# Patient Record
Sex: Female | Born: 2005 | Hispanic: Yes | Marital: Single | Smoking: Never smoker
Health system: Southern US, Community
[De-identification: ages and names within clinical notes are randomized; demographics above are authoritative.]

## PROBLEM LIST (undated history)

## (undated) DIAGNOSIS — T300 Burn of unspecified body region, unspecified degree: Secondary | ICD-10-CM

## (undated) DIAGNOSIS — Z789 Other specified health status: Secondary | ICD-10-CM

## (undated) HISTORY — DX: Other specified health status: Z78.9

## (undated) HISTORY — PX: TONSILLECTOMY: SUR1361

## (undated) HISTORY — PX: MYRINGOTOMY WITH TUBE PLACEMENT: SHX5663

---
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
## 2024-02-27 ENCOUNTER — Ambulatory Visit (INDEPENDENT_AMBULATORY_CARE_PROVIDER_SITE_OTHER): Payer: Medicaid Other | Admitting: Pediatrics

## 2024-02-27 VITALS — Wt 200.4 lb

## 2024-02-27 DIAGNOSIS — Z308 Encounter for other contraceptive management: Secondary | ICD-10-CM | POA: Diagnosis not present

## 2024-02-27 DIAGNOSIS — H9201 Otalgia, right ear: Secondary | ICD-10-CM

## 2024-02-27 MED ORDER — CIPROFLOXACIN-DEXAMETHASONE 0.3-0.1 % OT SUSP: 4.0000 [drp] | Freq: Two times a day (BID) | OTIC | 0 refills | Status: AC

## 2024-02-27 MED ORDER — NORGESTIMATE-ETH ESTRADIOL 0.25-35 MG-MCG PO TABS: 1.0000 | ORAL_TABLET | Freq: Every day | ORAL | 11 refills | Status: AC

## 2024-02-27 NOTE — Progress Notes (Signed)
  Subjective:    Patricia Gibson is a 18 y.o. old female here by for Follow-up (Left ear pain and blood) .    HPI  Has had some spotting despite OCPs -  Dark in color Gets nausea  Still taking - about to finish this current pack Would be interested in establishing with GYN  Right ear - bleeding - no remembered injury Has had PE tube in the past  Review of Systems  Constitutional:  Negative for activity change, appetite change and unexpected weight change.  Genitourinary:  Negative for vaginal pain.       Objective:    Wt 200 lb 6.4 oz (90.9 kg)   BMI 35.96 kg/m  Physical Exam Constitutional:      Appearance: Normal appearance.  HENT:     Left Ear: Tympanic membrane normal.     Ears:     Comments: Appears to have ball of wax in right ear canal with ?extruded PE tube vs hard wax vs cotton in canal Cardiovascular:     Rate and Rhythm: Normal rate and regular rhythm.  Pulmonary:     Effort: Pulmonary effort is normal.     Breath sounds: Normal breath sounds.  Neurological:     Mental Status: She is alert.        Assessment and Plan:     Patricia Gibson was seen today for Follow-up (Left ear pain and blood) .   Problem List Items Addressed This Visit   None Visit Diagnoses       Right ear pain    -  Primary     Encounter for other contraceptive management          Right ear - has wax in ear canal and some associated pain. Unable to remove with curette. Will reer back to ENT  Long discussion about OCP options will change to different class of endocrine. Will need ongoing survellience - can be here with me, adolescent NP, or can choose to go to GYN Will think about it and then let us  know  Time spent reviewing chart in preparation for visit: 5 minutes Time spent face-to-face with patient: 15 minutes Time spent not face-to-face with patient for documentation and care coordination on date of service: 5 minutes   No follow-ups on file.  Alvena Aurora, MD

## 2024-03-04 DIAGNOSIS — L929 Granulomatous disorder of the skin and subcutaneous tissue, unspecified: Secondary | ICD-10-CM | POA: Diagnosis not present

## 2024-03-18 DIAGNOSIS — L929 Granulomatous disorder of the skin and subcutaneous tissue, unspecified: Secondary | ICD-10-CM | POA: Diagnosis not present

## 2024-03-20 ENCOUNTER — Ambulatory Visit (INDEPENDENT_AMBULATORY_CARE_PROVIDER_SITE_OTHER): Admitting: Pediatrics

## 2024-03-20 ENCOUNTER — Encounter: Payer: Self-pay | Admitting: Pediatrics

## 2024-03-20 VITALS — Temp 98.2°F | Wt 200.2 lb

## 2024-03-20 DIAGNOSIS — B349 Viral infection, unspecified: Secondary | ICD-10-CM

## 2024-03-20 LAB — POC SOFIA 2 FLU + SARS ANTIGEN FIA
Influenza A, POC: NEGATIVE
Influenza B, POC: NEGATIVE
SARS Coronavirus 2 Ag: NEGATIVE
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
## 2024-03-22 ENCOUNTER — Ambulatory Visit
Admission: RE | Admit: 2024-03-22 | Discharge: 2024-03-22 | Disposition: A | Discharge status: Home / Self Care | Source: Ambulatory Visit | Attending: Internal Medicine | Admitting: Internal Medicine

## 2024-03-22 ENCOUNTER — Other Ambulatory Visit: Payer: Self-pay

## 2024-03-22 VITALS — BP 122/78 | HR 83 | Temp 98.2°F | Resp 18

## 2024-03-22 DIAGNOSIS — N3000 Acute cystitis without hematuria: Secondary | ICD-10-CM | POA: Diagnosis not present

## 2024-03-22 LAB — POCT URINALYSIS DIP (MANUAL ENTRY)
Bilirubin, UA: NEGATIVE
Glucose, UA: 100 mg/dL — AB
Ketones, POC UA: NEGATIVE mg/dL
Leukocytes, UA: NEGATIVE
Nitrite, UA: POSITIVE — AB
Protein Ur, POC: 30 mg/dL — AB
Spec Grav, UA: >=1.03 — AB (ref 1.010–1.025)
Urobilinogen, UA: 0.2 U/dL
pH, UA: 6 (ref 5.0–8.0)

## 2024-03-22 MED ORDER — NITROFURANTOIN MONOHYD MACRO 100 MG PO CAPS: 100.0000 mg | ORAL_CAPSULE | Freq: Two times a day (BID) | ORAL | 0 refills | Status: DC

## 2024-03-22 NOTE — Discharge Instructions (Addendum)
 Urinalysis done today shows a urinary tract infection. We will treat this with antibiotics. We will treat with the following:  Macrobid 100 mg twice daily for 5 days. This is an antibiotic. Drink plenty of water, stay hydrated. Avoid a lot of caffeine. Tylenol  or ibuprofen  for fever or pain.  Return to urgent care or PCP if symptoms worsen or fail to resolve.

## 2024-03-22 NOTE — ED Triage Notes (Signed)
 Pt here for dysuria; pt sts sore throat and fever 1 week ago that is resolved; pt seen by PCP and had testing; pt sts now having dysuria x 3 days
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
## 2024-04-25 ENCOUNTER — Encounter: Payer: Self-pay | Admitting: Emergency Medicine

## 2024-04-25 ENCOUNTER — Ambulatory Visit
Admission: EM | Admit: 2024-04-25 | Discharge: 2024-04-25 | Disposition: A | Discharge status: Home / Self Care | Attending: Physician Assistant | Admitting: Physician Assistant

## 2024-04-25 DIAGNOSIS — N3001 Acute cystitis with hematuria: Secondary | ICD-10-CM | POA: Diagnosis not present

## 2024-04-25 LAB — POCT URINALYSIS DIP (MANUAL ENTRY)
Bilirubin, UA: NEGATIVE
Glucose, UA: 100 mg/dL — AB
Ketones, POC UA: NEGATIVE mg/dL
Nitrite, UA: POSITIVE — AB
Protein Ur, POC: 30 mg/dL — AB
Spec Grav, UA: 1.025 (ref 1.010–1.025)
Urobilinogen, UA: 0.2 U/dL
pH, UA: 6 (ref 5.0–8.0)

## 2024-04-25 LAB — POCT FASTING CBG KUC MANUAL ENTRY: POCT Glucose (KUC): 149 mg/dL — AB (ref 70–99)

## 2024-04-25 MED ORDER — SULFAMETHOXAZOLE-TRIMETHOPRIM 800-160 MG PO TABS: 1.0000 | ORAL_TABLET | Freq: Two times a day (BID) | ORAL | 0 refills | Status: AC

## 2024-04-25 NOTE — ED Triage Notes (Signed)
 Pt reports painful urination and urinary frequency x 1 day. States tried AZO this morning with no relief.
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
## 2024-04-25 NOTE — Discharge Instructions (Addendum)
 Your blood sugar is not at a level that is concerning for diabetes.  I do recommend that you follow-up with your primary care to investigate this further.  We will contact you if we need to arrange additional treatment based on your culture results.  Start Bactrim  DS twice daily for 5 days.  If you have any rash or oral lesions stop the medication to be seen immediately.  This and all antibiotics can decrease the effectiveness of your birth control.  Use backup birth control such as a condom while taking this medication and until your next menstrual cycle.  You had blood in your urine which I believe is related to the infection.  Please follow-up with your primary care in a few weeks to have this repeated to make sure it goes away once we treat the infection.  Make sure you drink plenty of fluid.  If anything worsens and you have high fever, worsening abdominal pain, upper back pain, nausea/vomiting you need to be seen immediately.

## 2024-04-26 LAB — URINE CULTURE: Culture: <10000 — AB

## 2024-04-28 ENCOUNTER — Ambulatory Visit (HOSPITAL_COMMUNITY): Payer: Self-pay
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
## 2024-06-11 ENCOUNTER — Encounter: Admitting: Obstetrics and Gynecology

## 2024-07-08 DIAGNOSIS — N939 Abnormal uterine and vaginal bleeding, unspecified: Secondary | ICD-10-CM | POA: Diagnosis not present

## 2024-10-07 ENCOUNTER — Other Ambulatory Visit: Payer: Self-pay | Admitting: Pediatrics

## 2024-10-07 DIAGNOSIS — J111 Influenza due to unidentified influenza virus with other respiratory manifestations: Secondary | ICD-10-CM

## 2024-10-07 MED ORDER — OSELTAMIVIR PHOSPHATE 75 MG PO CAPS: 75.0000 mg | ORAL_CAPSULE | Freq: Two times a day (BID) | ORAL | 0 refills | Status: AC

## 2024-10-07 NOTE — Progress Notes (Signed)
 Patient in clinic today with cough, feeling tired, and chills and exposure to flu from both of her little sisters.  No fever, but temp has been 100 F.  Rx for tamiflu  sent to start if she begins having fever of 101 F or higher.  Reviewed potential side effects for tamiflu  and reasons to return to care.  Patricia Glendia Shorts, MD

## 2024-10-14 ENCOUNTER — Encounter: Payer: Self-pay | Admitting: Pediatrics

## 2024-10-14 ENCOUNTER — Ambulatory Visit (INDEPENDENT_AMBULATORY_CARE_PROVIDER_SITE_OTHER): Admitting: Family

## 2024-10-14 ENCOUNTER — Encounter: Payer: Self-pay | Admitting: Family

## 2024-10-14 VITALS — BP 118/74 | HR 75 | Temp 97.8°F | Ht 62.3 in | Wt 195.2 lb

## 2024-10-14 DIAGNOSIS — R059 Cough, unspecified: Secondary | ICD-10-CM | POA: Diagnosis not present

## 2024-10-14 DIAGNOSIS — B349 Viral infection, unspecified: Secondary | ICD-10-CM | POA: Diagnosis not present

## 2024-10-14 LAB — POCT INFLUENZA A/B
Influenza A, POC: NEGATIVE
Influenza B, POC: NEGATIVE

## 2024-10-14 NOTE — Progress Notes (Signed)
 " History was provided by the patient.  Patricia Gibson is a 18 y.o. female who is here for probable flu.   PCP confirmed? Yes.    Delores Clapper, MD  Plan from last visit 10/07/24:  Patient in clinic today with cough, feeling tired, and chills and exposure to flu from both of her little sisters.  No fever, but temp has been 100 F.  Rx for tamiflu  sent to start if she begins having fever of 101 F or higher.  Reviewed potential side effects for tamiflu  and reasons to return to care.   [name], MD   Pertinent Labs:   Chart/Growth Chart Review:  Body mass index is 35.36 kg/m.   Chief Complaint:   Sister had flu think she has flu and chest pain from all the coughing    HPI:    -sisters were sick first; started feeling bad last Monday  -had fevers, body aches; still having cough  -after cough will feel pressure on chest  -having nasal congestion  -clear secretions and sputum; has not seen anything purulent  -did Tylenol  and NyQuil for bedtime  -no wheezing or SOB  -mom started having symptoms of pneumonia and got an antibiotic  -works at [employer]; missed one day last week and is scheduled again for Thursday  -had + OTC test on Monday and wanted to be sure since holiday coming up and work schedule   Patient Active Problem List   Diagnosis Date Noted   Generalized abdominal pain 03/08/2022   Stye 04/17/2019   Wears glasses 07/13/2017   Flat foot 07/27/2016   Patellofemoral pain syndrome of left knee 07/27/2016   Obesity 07/28/2015   Episodic tension-type headache, not intractable 07/28/2015   Low serum vitamin D  07/28/2015   BMI (body mass index), pediatric, greater than or equal to 95% for age 46/26/2016   Acanthosis nigricans 06/11/2015    Medications Ordered Prior to Encounter[1]  Allergies[2]  Physical Exam:    Vitals:   10/14/24 1446  BP: 118/74  Pulse: 75  Temp: 97.8 F (36.6 C)  Weight: 195 lb 3.2 oz (88.5 kg)  Height: 5' 2.3 (1.582 m)     Blood pressure %iles are not available for patients who are 18 years or older. No LMP recorded. (Menstrual status: Irregular Periods).  Physical Exam Constitutional:      General: She is not in acute distress.    Appearance: She is well-developed. She is ill-appearing.  HENT:     Head: Normocephalic and atraumatic.     Nose: Congestion and rhinorrhea present.     Mouth/Throat:     Mouth: Mucous membranes are moist.     Pharynx: Posterior oropharyngeal erythema present. No oropharyngeal exudate.  Eyes:     General: No scleral icterus.    Extraocular Movements: Extraocular movements intact.     Pupils: Pupils are equal, round, and reactive to light.  Neck:     Thyroid : No thyromegaly.  Cardiovascular:     Rate and Rhythm: Normal rate and regular rhythm.     Heart sounds: Normal heart sounds. No murmur heard. Pulmonary:     Effort: Pulmonary effort is normal.     Breath sounds: Normal breath sounds. No wheezing, rhonchi or rales.     Comments: Wet cough throughout exam  Abdominal:     Palpations: Abdomen is soft.  Musculoskeletal:        General: Normal range of motion.     Cervical back: Normal range of motion and  neck supple.  Lymphadenopathy:     Cervical: No cervical adenopathy.  Skin:    General: Skin is warm and dry.     Findings: No rash.  Neurological:     Mental Status: She is alert and oriented to person, place, and time.     Cranial Nerves: No cranial nerve deficit.  Psychiatric:        Behavior: Behavior normal.        Thought Content: Thought content normal.        Judgment: Judgment normal.      Assessment/Plan: 1. Cough, unspecified type (Primary) 2. Viral illness -supportive care measures reviewed; although test negative today (about 8 days from symptoms start), advised to stay out of work/stay home, avoid contacts, hand washing, and manage symptoms with OTC medications; out of work until symptoms improve.  -return precautions reviewed - POCT  Influenza A/B        [1]  Current Outpatient Medications on File Prior to Visit  Medication Sig Dispense Refill   ciprofloxacin -dexamethasone  (CIPRODEX ) OTIC suspension Place 4 drops into the right ear 2 (two) times daily. 7.5 mL 0   norgestimate -ethinyl estradiol  (SPRINTEC 28) 0.25-35 MG-MCG tablet Take 1 tablet by mouth daily. 28 tablet 11   Vitamin D , Ergocalciferol , (DRISDOL ) 1.25 MG (50000 UNIT) CAPS capsule Take 1 capsule (50,000 Units total) by mouth every 7 (seven) days. 12 capsule 0   No current facility-administered medications on file prior to visit.  [2] No Known Allergies  "

## 2024-11-20 ENCOUNTER — Ambulatory Visit: Payer: Self-pay | Admitting: Pediatrics

## 2024-11-20 ENCOUNTER — Encounter: Payer: Self-pay | Admitting: Pediatrics

## 2024-11-20 ENCOUNTER — Other Ambulatory Visit (HOSPITAL_COMMUNITY)
Admission: RE | Admit: 2024-11-20 | Discharge: 2024-11-20 | Disposition: A | Source: Ambulatory Visit | Attending: Pediatrics | Admitting: Pediatrics

## 2024-11-20 VITALS — BP 118/82 | Ht 61.97 in | Wt 197.4 lb

## 2024-11-20 DIAGNOSIS — Z973 Presence of spectacles and contact lenses: Secondary | ICD-10-CM

## 2024-11-20 DIAGNOSIS — R7989 Other specified abnormal findings of blood chemistry: Secondary | ICD-10-CM | POA: Diagnosis not present

## 2024-11-20 DIAGNOSIS — Z130 Encounter for screening for diseases of the blood and blood-forming organs and certain disorders involving the immune mechanism: Secondary | ICD-10-CM

## 2024-11-20 DIAGNOSIS — E669 Obesity, unspecified: Secondary | ICD-10-CM

## 2024-11-20 DIAGNOSIS — Z1331 Encounter for screening for depression: Secondary | ICD-10-CM

## 2024-11-20 DIAGNOSIS — Z13220 Encounter for screening for lipoid disorders: Secondary | ICD-10-CM

## 2024-11-20 DIAGNOSIS — Z114 Encounter for screening for human immunodeficiency virus [HIV]: Secondary | ICD-10-CM

## 2024-11-20 DIAGNOSIS — Z68.41 Body mass index (BMI) pediatric, greater than or equal to 95th percentile for age: Secondary | ICD-10-CM | POA: Diagnosis not present

## 2024-11-20 DIAGNOSIS — Z1339 Encounter for screening examination for other mental health and behavioral disorders: Secondary | ICD-10-CM | POA: Diagnosis not present

## 2024-11-20 DIAGNOSIS — Z131 Encounter for screening for diabetes mellitus: Secondary | ICD-10-CM

## 2024-11-20 DIAGNOSIS — Z113 Encounter for screening for infections with a predominantly sexual mode of transmission: Secondary | ICD-10-CM

## 2024-11-20 DIAGNOSIS — Z0000 Encounter for general adult medical examination without abnormal findings: Secondary | ICD-10-CM | POA: Diagnosis not present

## 2024-11-20 MED ORDER — SILVER SULFADIAZINE 1 % EX CREA: 1.0000 | TOPICAL_CREAM | Freq: Every day | CUTANEOUS | 0 refills | Status: AC
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
## 2024-11-20 NOTE — Patient Instructions (Addendum)
 Adult Primary Care Clinics Name Criteria Services  [facility] Insurance   [facility]  Uninsured  [place] A medical home for adults needing healthcare when it's not an emergency. Chronic disease management Disease prevention, diagnosis and treatment Onsite point-of-care laboratory testing. Health education and prevention programs. Physicals and immunizations  [address] [place], [place] [address]  Phone: [phone] Hours: Mon-Fri 9am-6pm Walk-in: Tues 2pm-5pm                 Thurs 8:30am-4:30pm Languages:  Language line available  Serves Adult patients  WALK IN HOURS FOR CLINIC [facility] DISCOUNT): TUESDAYS 2:00PM - 5:00PM and THURSDAYS 8:30AM - 4:30PM  Space is limited, 10 on Tuesday and 20 on Thursday. It's on first come first serve basis  Name Criteria Services  [facility] Insurance   [facility]  [address] [place], [place], [place] [address]  Phone: [phone]  Languages:  Access to language line     Primary Care at [facility] - also takes new medicaid patients   Optometrists who accept Medicaid  Updated 08/15/24  Accepts Medicaid for Eye Exam and Glasses   [facility] [address] Phone: [phone]  Open Monday- Saturday from 9 AM to 5 PM  [facility] [address] Phone: [phone] Open Monday -Friday (by appointment only) Ages 4 and older No se habla Espaol Accept Some Medicaid  [facility] [address] Phone: [phone] Mon- Fri 8:30- 4:30 Pm Se habla Espaol Accept Some MEDICAID [facility] [address] [place], [place]  Phone: [phone] Open Monday-Thrus 8-5 pm, Friday 8-1:45 pm   Se habla Espaol Accept  Some MEDICAID  [facility] [address] Phone: [phone] Open Monday-Friday Ages 5 and older No se habla Espaol Accept [employer] Medicaid   [facility] [phone] Phone: [phone] Age 22 year old and older Open Monday-Saturday Se habla Espaol Accept All Medicaid  [facility] [address] Phone: [phone] Open Monday-Friday Ages 15 and older No se habla Espaol Do Not Accept MEDICAID [facility] [address] [place], [place] [address] Phone: [phone] Open Mon-Sat 10am-6pm Minimum age: 72 years No se habla Espaol Accept [facility]   [facility] [address] Open Mon 1pm-7pm, Tue-Thur 8am-5:30pm, Fri 8am-4:30pm Minimum age: 77 years No se habla Espaol Accept Some MEDICAID  [facility] [place]: [place] Ronnald Bl[place], [place] [id number] [address], [place], [place] [address] Opens Mon-Fri 8-5 pm Accept [place], [employer], & Medicaid Direct.       Accepts Medicaid for Eye Exam only (will have to pay for glasses)   [facility] [address] Phone: [phone] Open 7 days per week Ages 5 and older (must know alphabet) No se habla Espaol  [facility] [address] [facility]  Phone: [phone] Open 7 days per week Ages 67 and older (must know alphabet) No se habla [facility] [address] [place], [address] Phone: [phone] Open Monday-Saturday Ages 6 years and older Se habla Espaol Accept Some Medicaid Plan [facility] [address] [facility] Phone: [phone] Open 7  days per week Ages 56 and older (must know alphabet) No se habla Espaol    Optometrists who do NOT accept Medicaid for Exam or Glasses [facility] [address] [place], [place] [address] Phone: [phone] Open Mon-Friday 8am-5pm Minimum age: 684 years No se habla [facility] [address] [place], [place], [place] [address] Phone:  [phone] Open Mon-Thur 8am-5pm, Fri 8am-2pm Minimum age: 684 years No se habla Espaol Do Not Accept MEDICAID   [facility] [address] [place], [place], [place] [address] Phone: [phone] Open Mon-Friday 10am-7pm, Sat 10am-4pm Minimum age: 684 years No se habla Espaol Do Not Accept MEDICAID [facility] [address], [place], [place] [address] Phone: [phone] Open Mon-Thur 8am-5pm, Fri 8am-4pm Minimum age: 684 years No se habla Espaol Do Not Accept MEDICAID  [facility] [address] [place], [place] [address] Phone: [phone] Open Mon-Fri 9am-1pm Minimum age: 66 years No se habla Espaol

## 2024-11-21 ENCOUNTER — Other Ambulatory Visit: Payer: Self-pay

## 2024-11-21 LAB — LIPID PANEL
Cholesterol: 172 mg/dL — ABNORMAL HIGH
HDL: 51 mg/dL
LDL Cholesterol (Calc): 95 mg/dL
Non-HDL Cholesterol (Calc): 121 mg/dL — ABNORMAL HIGH
Total CHOL/HDL Ratio: 3.4 (calc)
Triglycerides: 163 mg/dL — ABNORMAL HIGH

## 2024-11-21 LAB — COMPREHENSIVE METABOLIC PANEL WITH GFR
AG Ratio: 1.8 (calc) (ref 1.0–2.5)
ALT: 69 U/L — ABNORMAL HIGH (ref 5–32)
AST: 35 U/L — ABNORMAL HIGH (ref 12–32)
Albumin: 4.6 g/dL (ref 3.6–5.1)
Alkaline phosphatase (APISO): 75 U/L (ref 36–128)
BUN: 12 mg/dL (ref 7–20)
CO2: 26 mmol/L (ref 20–32)
Calcium: 10 mg/dL (ref 8.9–10.4)
Chloride: 104 mmol/L (ref 98–110)
Creat: 0.54 mg/dL (ref 0.50–0.96)
Globulin: 2.6 g/dL (ref 2.0–3.8)
Glucose, Bld: 109 mg/dL (ref 65–139)
Potassium: 4.4 mmol/L (ref 3.8–5.1)
Sodium: 138 mmol/L (ref 135–146)
Total Bilirubin: 0.5 mg/dL (ref 0.2–1.1)
Total Protein: 7.2 g/dL (ref 6.3–8.2)
eGFR: 136 mL/min/{1.73_m2}

## 2024-11-21 LAB — CBC WITH DIFFERENTIAL/PLATELET
Absolute Lymphocytes: 2059 {cells}/uL (ref 850–3900)
Absolute Monocytes: 378 {cells}/uL (ref 200–950)
Basophils Absolute: 18 {cells}/uL (ref 0–200)
Basophils Relative: 0.3 %
Eosinophils Absolute: 142 {cells}/uL (ref 15–500)
Eosinophils Relative: 2.4 %
HCT: 40.2 % (ref 35.9–46.0)
Hemoglobin: 13.2 g/dL (ref 11.7–15.5)
MCH: 29.6 pg (ref 27.0–33.0)
MCHC: 32.8 g/dL (ref 31.6–35.4)
MCV: 90.1 fL (ref 81.4–101.7)
MPV: 10.3 fL (ref 7.5–12.5)
Monocytes Relative: 6.4 %
Neutro Abs: 3304 {cells}/uL (ref 1500–7800)
Neutrophils Relative %: 56 %
Platelets: 282 10*3/uL (ref 140–400)
RBC: 4.46 Million/uL (ref 3.80–5.10)
RDW: 11.5 % (ref 11.0–15.0)
Total Lymphocyte: 34.9 %
WBC: 5.9 10*3/uL (ref 3.8–10.8)

## 2024-11-21 LAB — URINE CYTOLOGY ANCILLARY ONLY
Chlamydia: NEGATIVE
Comment: NEGATIVE
Comment: NEGATIVE
Comment: NORMAL
Neisseria Gonorrhea: NEGATIVE
Trichomonas: NEGATIVE

## 2024-11-21 LAB — VITAMIN D 25 HYDROXY (VIT D DEFICIENCY, FRACTURES): Vit D, 25-Hydroxy: 21 ng/mL — ABNORMAL LOW (ref 30–100)
